# Patient Record
Sex: Male | Born: 1965 | Race: Black or African American | Hispanic: No | Smoking: Never smoker
Health system: Southern US, Community
[De-identification: ages and names within clinical notes are randomized; demographics above are authoritative.]

## PROBLEM LIST (undated history)

## (undated) DIAGNOSIS — E781 Pure hyperglyceridemia: Secondary | ICD-10-CM

## (undated) DIAGNOSIS — R079 Chest pain, unspecified: Secondary | ICD-10-CM

## (undated) DIAGNOSIS — N179 Acute kidney failure, unspecified: Secondary | ICD-10-CM

## (undated) DIAGNOSIS — I471 Supraventricular tachycardia, unspecified: Secondary | ICD-10-CM

## (undated) DIAGNOSIS — A530 Latent syphilis, unspecified as early or late: Secondary | ICD-10-CM

## (undated) DIAGNOSIS — I10 Essential (primary) hypertension: Secondary | ICD-10-CM

## (undated) DIAGNOSIS — F528 Other sexual dysfunction not due to a substance or known physiological condition: Secondary | ICD-10-CM

## (undated) HISTORY — PX: NO PAST SURGERIES: SHX2092

## (undated) HISTORY — DX: Acute kidney failure, unspecified: N17.9

## (undated) HISTORY — DX: Chest pain, unspecified: R07.9

## (undated) HISTORY — DX: Pure hyperglyceridemia: E78.1

## (undated) HISTORY — DX: Latent syphilis, unspecified as early or late: A53.0

## (undated) HISTORY — DX: Supraventricular tachycardia, unspecified: I47.10

## (undated) HISTORY — DX: Other sexual dysfunction not due to a substance or known physiological condition: F52.8

## (undated) HISTORY — DX: Supraventricular tachycardia: I47.1

---

## 2002-09-06 ENCOUNTER — Encounter: Payer: Self-pay | Admitting: Emergency Medicine

## 2002-09-06 ENCOUNTER — Emergency Department (HOSPITAL_COMMUNITY): Admission: EM | Admit: 2002-09-06 | Discharge: 2002-09-06 | Payer: Self-pay | Admitting: Emergency Medicine

## 2004-07-07 ENCOUNTER — Emergency Department (HOSPITAL_COMMUNITY): Admission: EM | Admit: 2004-07-07 | Discharge: 2004-07-07 | Payer: Self-pay | Admitting: Emergency Medicine

## 2005-07-01 DIAGNOSIS — I10 Essential (primary) hypertension: Secondary | ICD-10-CM

## 2005-07-01 HISTORY — DX: Essential (primary) hypertension: I10

## 2005-09-17 ENCOUNTER — Emergency Department (HOSPITAL_COMMUNITY): Admission: EM | Admit: 2005-09-17 | Discharge: 2005-09-17 | Payer: Self-pay | Admitting: Family Medicine

## 2005-12-02 ENCOUNTER — Emergency Department (HOSPITAL_COMMUNITY): Admission: EM | Admit: 2005-12-02 | Discharge: 2005-12-02 | Payer: Self-pay | Admitting: Emergency Medicine

## 2006-04-29 ENCOUNTER — Emergency Department (HOSPITAL_COMMUNITY): Admission: EM | Admit: 2006-04-29 | Discharge: 2006-04-29 | Payer: Self-pay | Admitting: Family Medicine

## 2006-09-17 ENCOUNTER — Emergency Department (HOSPITAL_COMMUNITY): Admission: EM | Admit: 2006-09-17 | Discharge: 2006-09-17 | Payer: Self-pay | Admitting: Emergency Medicine

## 2007-01-12 ENCOUNTER — Emergency Department (HOSPITAL_COMMUNITY): Admission: EM | Admit: 2007-01-12 | Discharge: 2007-01-12 | Payer: Self-pay | Admitting: Internal Medicine

## 2008-01-04 ENCOUNTER — Emergency Department (HOSPITAL_COMMUNITY): Admission: EM | Admit: 2008-01-04 | Discharge: 2008-01-05 | Payer: Self-pay | Admitting: Emergency Medicine

## 2008-07-12 ENCOUNTER — Emergency Department (HOSPITAL_COMMUNITY): Admission: EM | Admit: 2008-07-12 | Discharge: 2008-07-12 | Payer: Self-pay | Admitting: Emergency Medicine

## 2008-09-28 ENCOUNTER — Ambulatory Visit: Payer: Self-pay | Admitting: Nurse Practitioner

## 2008-09-28 DIAGNOSIS — F528 Other sexual dysfunction not due to a substance or known physiological condition: Secondary | ICD-10-CM | POA: Insufficient documentation

## 2008-09-29 ENCOUNTER — Ambulatory Visit: Payer: Self-pay | Admitting: *Deleted

## 2008-10-05 DIAGNOSIS — A530 Latent syphilis, unspecified as early or late: Secondary | ICD-10-CM | POA: Insufficient documentation

## 2008-10-06 ENCOUNTER — Encounter (INDEPENDENT_AMBULATORY_CARE_PROVIDER_SITE_OTHER): Payer: Self-pay | Admitting: Nurse Practitioner

## 2008-10-07 ENCOUNTER — Telehealth (INDEPENDENT_AMBULATORY_CARE_PROVIDER_SITE_OTHER): Payer: Self-pay | Admitting: Nurse Practitioner

## 2008-10-07 ENCOUNTER — Ambulatory Visit: Payer: Self-pay | Admitting: Nurse Practitioner

## 2008-10-07 DIAGNOSIS — E781 Pure hyperglyceridemia: Secondary | ICD-10-CM | POA: Insufficient documentation

## 2008-10-07 LAB — CONVERTED CEMR LAB: HDL goal, serum: 40 mg/dL

## 2009-07-04 ENCOUNTER — Emergency Department (HOSPITAL_COMMUNITY): Admission: EM | Admit: 2009-07-04 | Discharge: 2009-07-05 | Payer: Self-pay | Admitting: Emergency Medicine

## 2010-02-19 ENCOUNTER — Encounter: Admission: RE | Admit: 2010-02-19 | Discharge: 2010-02-19 | Payer: Self-pay | Admitting: Family Medicine

## 2010-07-05 ENCOUNTER — Emergency Department (HOSPITAL_COMMUNITY)
Admission: EM | Admit: 2010-07-05 | Discharge: 2010-07-05 | Payer: Self-pay | Source: Home / Self Care | Admitting: Emergency Medicine

## 2010-07-29 LAB — CONVERTED CEMR LAB
AST: 26 units/L (ref 0–37)
Albumin: 4.5 g/dL (ref 3.5–5.2)
Alkaline Phosphatase: 66 units/L (ref 39–117)
BUN: 18 mg/dL (ref 6–23)
Basophils Absolute: 0 10*3/uL (ref 0.0–0.1)
Bilirubin Urine: NEGATIVE
Eosinophils Relative: 1 % (ref 0–5)
Glucose, Urine, Semiquant: NEGATIVE
HCT: 48 % (ref 39.0–52.0)
HDL: 31 mg/dL — ABNORMAL LOW (ref >39)
Ketones, urine, test strip: NEGATIVE
Lymphocytes Relative: 33 % (ref 12–46)
Neutro Abs: 5.9 10*3/uL (ref 1.7–7.7)
Nitrite: NEGATIVE
Platelets: 175 10*3/uL (ref 150–400)
Potassium: 4.4 meq/L (ref 3.5–5.3)
Protein, U semiquant: 30
RDW: 15.2 % (ref 11.5–15.5)
RPR Ser Ql: REACTIVE — AB
Specific Gravity, Urine: 1.02
TSH: 0.804 microintl units/mL (ref 0.350–4.500)
Total CHOL/HDL Ratio: 5.8
Urobilinogen, UA: 0.2
WBC Urine, dipstick: NEGATIVE
pH: 5.5

## 2010-09-16 LAB — DIFFERENTIAL
Eosinophils Relative: 0 % (ref 0–5)
Lymphocytes Relative: 11 % — ABNORMAL LOW (ref 12–46)
Lymphs Abs: 1.8 10*3/uL (ref 0.7–4.0)
Monocytes Absolute: 0.4 10*3/uL (ref 0.1–1.0)
Monocytes Relative: 3 % (ref 3–12)

## 2010-09-16 LAB — URINALYSIS, ROUTINE W REFLEX MICROSCOPIC
Leukocytes, UA: NEGATIVE
Nitrite: NEGATIVE
Specific Gravity, Urine: 1.022 (ref 1.005–1.030)
pH: 5.5 (ref 5.0–8.0)

## 2010-09-16 LAB — CBC
Platelets: 240 10*3/uL (ref 150–400)
RDW: 14 % (ref 11.5–15.5)
WBC: 16.8 10*3/uL — ABNORMAL HIGH (ref 4.0–10.5)

## 2010-09-16 LAB — BASIC METABOLIC PANEL
BUN: 18 mg/dL (ref 6–23)
Creatinine, Ser: 1.45 mg/dL (ref 0.4–1.5)
GFR calc non Af Amer: 54 mL/min — ABNORMAL LOW (ref >60)

## 2010-09-16 LAB — URINE MICROSCOPIC-ADD ON

## 2010-09-16 LAB — POCT CARDIAC MARKERS: Troponin i, poc: <0.05 ng/mL (ref 0.00–0.09)

## 2010-10-15 LAB — URINALYSIS, ROUTINE W REFLEX MICROSCOPIC
Ketones, ur: NEGATIVE mg/dL
Nitrite: NEGATIVE
Protein, ur: 30 mg/dL — AB
pH: 6.5 (ref 5.0–8.0)

## 2011-04-16 LAB — DIFFERENTIAL
Basophils Absolute: 0
Basophils Relative: 1
Neutro Abs: 5.4
Neutrophils Relative %: 61

## 2011-04-16 LAB — BASIC METABOLIC PANEL
CO2: 29
Calcium: 9.3
Creatinine, Ser: 1.45
GFR calc Af Amer: >60
Glucose, Bld: 105 — ABNORMAL HIGH

## 2011-04-16 LAB — CBC
MCHC: 33.7
RDW: 14.4 — ABNORMAL HIGH

## 2011-04-16 LAB — URINALYSIS, ROUTINE W REFLEX MICROSCOPIC
Nitrite: NEGATIVE
Specific Gravity, Urine: 1.017
Urobilinogen, UA: 1

## 2011-04-16 LAB — URINE MICROSCOPIC-ADD ON: Urine-Other: NONE SEEN

## 2011-12-11 ENCOUNTER — Emergency Department (HOSPITAL_COMMUNITY): Payer: Self-pay

## 2011-12-11 ENCOUNTER — Observation Stay (HOSPITAL_COMMUNITY)
Admission: EM | Admit: 2011-12-11 | Discharge: 2011-12-12 | Disposition: A | Discharge status: Home / Self Care | Payer: Self-pay | Attending: Family Medicine | Admitting: Family Medicine

## 2011-12-11 ENCOUNTER — Encounter (HOSPITAL_COMMUNITY): Payer: Self-pay | Admitting: *Deleted

## 2011-12-11 DIAGNOSIS — I10 Essential (primary) hypertension: Secondary | ICD-10-CM

## 2011-12-11 DIAGNOSIS — R0602 Shortness of breath: Secondary | ICD-10-CM | POA: Insufficient documentation

## 2011-12-11 DIAGNOSIS — R209 Unspecified disturbances of skin sensation: Secondary | ICD-10-CM | POA: Insufficient documentation

## 2011-12-11 DIAGNOSIS — I471 Supraventricular tachycardia, unspecified: Secondary | ICD-10-CM | POA: Diagnosis present

## 2011-12-11 DIAGNOSIS — I129 Hypertensive chronic kidney disease with stage 1 through stage 4 chronic kidney disease, or unspecified chronic kidney disease: Secondary | ICD-10-CM | POA: Insufficient documentation

## 2011-12-11 DIAGNOSIS — R079 Chest pain, unspecified: Secondary | ICD-10-CM

## 2011-12-11 DIAGNOSIS — I498 Other specified cardiac arrhythmias: Principal | ICD-10-CM | POA: Insufficient documentation

## 2011-12-11 DIAGNOSIS — N179 Acute kidney failure, unspecified: Secondary | ICD-10-CM

## 2011-12-11 DIAGNOSIS — N183 Chronic kidney disease, stage 3 unspecified: Secondary | ICD-10-CM | POA: Insufficient documentation

## 2011-12-11 HISTORY — DX: Essential (primary) hypertension: I10

## 2011-12-11 LAB — BASIC METABOLIC PANEL
Chloride: 101 mEq/L (ref 96–112)
Creatinine, Ser: 2.26 mg/dL — ABNORMAL HIGH (ref 0.50–1.35)
GFR calc Af Amer: 39 mL/min — ABNORMAL LOW (ref >90)
GFR calc non Af Amer: 34 mL/min — ABNORMAL LOW (ref >90)
Potassium: 4.1 mEq/L (ref 3.5–5.1)

## 2011-12-11 LAB — TROPONIN I: Troponin I: <0.3 ng/mL (ref <0.30)

## 2011-12-11 LAB — CBC
MCV: 78.7 fL (ref 78.0–100.0)
Platelets: 168 10*3/uL (ref 150–400)
RDW: 13.6 % (ref 11.5–15.5)
WBC: 12.2 10*3/uL — ABNORMAL HIGH (ref 4.0–10.5)

## 2011-12-11 MED ORDER — NITROGLYCERIN 0.4 MG SL SUBL: 0.4000 mg | SUBLINGUAL_TABLET | SUBLINGUAL | Status: DC | PRN

## 2011-12-11 MED ORDER — SODIUM CHLORIDE 0.9 % IV SOLN
Freq: Once | INTRAVENOUS | Status: AC
  Administered 2011-12-11: via INTRAVENOUS

## 2011-12-11 MED ORDER — ADENOSINE 6 MG/2ML IV SOLN
INTRAVENOUS | Status: AC
  Administered 2011-12-11
  Filled 2011-12-11: qty 8

## 2011-12-11 MED ORDER — SODIUM CHLORIDE 0.9 % IV BOLUS (SEPSIS)
1000.0000 mL | Freq: Once | INTRAVENOUS | Status: AC
  Administered 2011-12-11: 1000 mL via INTRAVENOUS

## 2011-12-11 MED ORDER — ASPIRIN EC 325 MG PO TBEC
325.0000 mg | DELAYED_RELEASE_TABLET | Freq: Once | ORAL | Status: AC
  Administered 2011-12-11: 325 mg via ORAL
  Filled 2011-12-11: qty 1

## 2011-12-11 NOTE — H&P (Signed)
Samuel Casey is an 46 y.o. male.   PCP - Dr.Hooper in [place]. Chief Complaint: Chest pain. HPI: 46 year old male with known history of hypertension today around 2:00 started hurting some chest pressure radiating to his left arm and has been coming off and on for almost an hour. His friends called the EMS. When EMS reached there patient was found to be in SVT and was given adenosine and the SVT was aborted. He had mild shortness of breath associated with the chest pain but denies any palpitations. Denies any nausea, vomiting abdominal pain diarrhea. Patient states over the last 2 days he's been feeling not well. When the patient reached the ER he was in sinus rhythm and cardiac enzymes are negative patient will be admitted for further management and observation. Patient denies having similar episodes before. In addition patient was found to have worsening creatinine.  Past Medical History  Diagnosis Date  . Hypertension     History reviewed. No pertinent past surgical history.  Family History  Problem Relation Age of Onset  . Sickle cell anemia Other    Social History:  reports that he has never smoked. He does not have any smokeless tobacco history on file. He reports that he does not drink alcohol or use illicit drugs.  Allergies: No Known Allergies   (Not in a hospital admission)  Results for orders placed during the hospital encounter of 12/11/11 (from the past 48 hour(s))  CBC     Status: Abnormal   Collection Time   12/11/11  7:31 PM      Component Value Range Comment   WBC 12.2 (*) 4.0 - 10.5 K/uL    RBC 5.92 (*) 4.22 - 5.81 MIL/uL    Hemoglobin 16.4  13.0 - 17.0 g/dL    HCT 86.5  78.4 - 69.6 %    MCV 78.7  78.0 - 100.0 fL    MCH 27.7  26.0 - 34.0 pg    MCHC 35.2  30.0 - 36.0 g/dL    RDW 29.5  28.4 - 13.2 %    Platelets 168  150 - 400 K/uL   BASIC METABOLIC PANEL     Status: Abnormal   Collection Time   12/11/11  7:31 PM      Component Value Range Comment   Sodium 140   135 - 145 mEq/L    Potassium 4.1  3.5 - 5.1 mEq/L    Chloride 101  96 - 112 mEq/L    CO2 25  19 - 32 mEq/L    Glucose, Bld 117 (*) 70 - 99 mg/dL    BUN 21  6 - 23 mg/dL    Creatinine, Ser 4.40 (*) 0.50 - 1.35 mg/dL    Calcium 10.2  8.4 - 10.5 mg/dL    GFR calc non Af Amer 34 (*) >90 mL/min    GFR calc Af Amer 39 (*) >90 mL/min   TROPONIN I     Status: Normal   Collection Time   12/11/11  7:31 PM      Component Value Range Comment   Troponin I <0.30  <0.30 ng/mL    Dg Chest 2 View  12/11/2011  *RADIOLOGY REPORT*  Clinical Data: Chest pressure.  CHEST - 2 VIEW  Comparison: PA and lateral chest 02/19/2010.  Findings: Lungs clear.  No pneumothorax or effusion.  Heart size normal.  IMPRESSION: Negative chest.  Original Report Authenticated By: Bernadene Bell. Maricela Curet, M.D.    Review of Systems  Constitutional: Negative.  HENT: Negative.   Eyes: Negative.   Respiratory: Positive for shortness of breath.   Cardiovascular: Positive for chest pain.  Gastrointestinal: Negative.   Genitourinary: Negative.   Musculoskeletal: Negative.   Skin: Negative.   Neurological: Negative.   Endo/Heme/Allergies: Negative.   Psychiatric/Behavioral: Negative.     Blood pressure 148/91, pulse 86, temperature 98.4 F (36.9 C), temperature source Oral, resp. rate 19, SpO2 99.00%. Physical Exam  Constitutional: He is oriented to person, place, and time. He appears well-developed and well-nourished. No distress.  HENT:  Head: Normocephalic and atraumatic.  Right Ear: External ear normal.  Left Ear: External ear normal.  Nose: Nose normal.  Mouth/Throat: Oropharynx is clear and moist. No oropharyngeal exudate.  Eyes: Conjunctivae are normal. Pupils are equal, round, and reactive to light. Right eye exhibits no discharge. Left eye exhibits no discharge. No scleral icterus.  Neck: Normal range of motion. Neck supple.  Cardiovascular: Normal rate and regular rhythm.   Respiratory: Effort normal and breath  sounds normal. No respiratory distress. He has no wheezes. He has no rales.  GI: Soft. Bowel sounds are normal. He exhibits no distension. There is no tenderness. There is no rebound.  Musculoskeletal: Normal range of motion. He exhibits no edema and no tenderness.  Neurological: He is alert and oriented to person, place, and time.       Moves all extremities.  Skin: Skin is warm and dry. He is not diaphoretic. No erythema.  Psychiatric: His behavior is normal.     Assessment/Plan #1. SVT - presently in sinus rhythm and rate control. Check thyroid function test for hyperthyroidism. Check d-dimer and if positive get a VQ scan. Continue Tenormin. #2. Chest pain - presently chest pain-free. Cycle cardiac markers. Check d-dimer. Check 2-D echo. Aspirin. #3. Acute renal failure probably on chronic kidney disease - at this time and gently hydrating patient. Check urinalysis. Closely follow intake output and metabolic panel. #4. Hypertension - continue present medications. #5. Leukocytosis - patient is afebrile. Follow CBC. UA is pending. Chest x-ray does not show any infiltrates.  CODE STATUS - full code.  Eduard Clos 12/11/2011, 11:37 PM

## 2011-12-11 NOTE — ED Notes (Signed)
Received report from [facility]. Per RN, pt begin having left sided CP and Left arm pain. When EMS was called his HR was between 130s-180s.  Adenosine was given x2 by EMS and HR decreased to 130s. Per pt he does not remember anything prior to him being in the ambulance. Currently he is having left sided Chest pressure that is 5 out of 10. He is also having left lower arm tingling. No respiratory distress. No n/v. Slight diaphoresis. HR in 120s-130s. Will continue to monitor.

## 2011-12-11 NOTE — ED Notes (Signed)
To ED via EMS for eval of left arm pain and cp. Found in SVT. Pt was given 2 does of Adenosine IVP by ems and converted to SR

## 2011-12-11 NOTE — ED Provider Notes (Signed)
History     CSN: [id number]  Arrival date & time 12/11/11  1857   None     Chief Complaint  Patient presents with  . Chest Pain    (Consider location/radiation/quality/duration/timing/severity/associated sxs/prior treatment) HPI  46 year old male chief complaint chest pain shortness of breath left arm paresthesia. 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 His administrators called EMS. He is amnestic to the events until being in the EMS truck. Per EMS he demonstrated a supraventricular tachycardia and was given adenosine which apparently extinguishes arrhythmia.  He presents now with 2/10 chest pressure, mild shortness of breath. Denies any recent illnesses. Endorses recent decrease in urine output. Denies any recent dyspnea with exertion or chest pain with exertion. Tachycardia on arrival.  Hypertensive on arrival. Otherwise normal vital signs.  Past Medical History  Diagnosis Date  . Hypertension     History reviewed. No pertinent past surgical history.  Family History  Problem Relation Age of Onset  . Sickle cell anemia Other     History  Substance Use Topics  . Smoking status: Never Smoker   . Smokeless tobacco: Not on file  . Alcohol Use: No      Review of Systems Constitutional: Negative for fever and chills.  HENT: Negative for ear pain, sore throat and trouble swallowing.   Eyes: Negative for pain and visual disturbance.  Respiratory: Negative for cough and POS shortness of breath.   Cardiovascular: POS for chest pain and leg swelling.  Gastrointestinal: Negative for nausea, vomiting, abdominal pain and diarrhea.  Genitourinary: Negative for dysuria, urgency and frequency.  Musculoskeletal: Negative for back pain and joint swelling.  Skin: Negative for rash and wound.  Neurological: Negative for dizziness, syncope, speech difficulty,  weakness and POS numbness LUE.     Allergies  Review of patient's allergies indicates no known allergies.  Home Medications   Current Outpatient Rx  Name Route Sig Dispense Refill  . AMLODIPINE BESYLATE 10 MG PO TABS Oral Take 10 mg by mouth daily.    . ATENOLOL 50 MG PO TABS Oral Take 50 mg by mouth daily.    Marland Kitchen CALCIUM CARBONATE 1250 MG PO TABS Oral Take 1 tablet by mouth daily.    Marland Kitchen HYDROCHLOROTHIAZIDE 25 MG PO TABS Oral Take 25 mg by mouth daily.    Marland Kitchen VITAMIN A 24401 UNITS PO CAPS Oral Take 10,000 Units by mouth daily.    Marland Kitchen VITAMIN B-12 1000 MCG PO TABS Oral Take 1,000 mcg by mouth daily.      BP 148/91  Pulse 86  Temp 98.4 F (36.9 C) (Oral)  Resp 19  SpO2 99%  Physical Exam Consitutional: Pt in no acute distress.   Head: Normocephalic and atraumatic.  Eyes: Extraocular motion intact, no scleral icterus Neck: Supple without meningismus, mass, or overt JVD Respiratory: Effort normal and breath sounds normal. No respiratory distress. CV: Heart regular rate and rhythm, no obvious murmurs.  Pulses +2 and symmetric Abdomen: Soft, non-tender, non-distended MSK: Extremities are atraumatic without deformity, ROM intact Skin: Warm, dry, intact Neuro: Alert and oriented, no motor deficit noted.  CNs intact.  PERRL.  Reflexes normal BLE, no clonus.  Hips, knee and ankle, arm and wrist strength maintained.  FTN and RAM normal.  CNs 2-12 normal.  Psychiatric: Mood and affect are normal  EKG:  Rate: 131 Rythym: sinus Interval normal Axis: appears LAD  RBBB Sharp T waves.  Greater amplitude of t-waves then previous.   ED Course  Procedures (including critical care time)  Labs Reviewed  CBC - Abnormal; Notable for the following:    WBC 12.2 (*)     RBC 5.92 (*)     All other components within normal limits  BASIC METABOLIC PANEL - Abnormal; Notable for the following:    Glucose, Bld 117 (*)     Creatinine, Ser 2.26 (*)     GFR calc non Af Amer 34 (*)     GFR calc Af  Amer 39 (*)     All other components within normal limits  TROPONIN I   Dg Chest 2 View  12/11/2011  *RADIOLOGY REPORT*  Clinical Data: Chest pressure.  CHEST - 2 VIEW  Comparison: PA and lateral chest 02/19/2010.  Findings: Lungs clear.  No pneumothorax or effusion.  Heart size normal.  IMPRESSION: Negative chest.  Original Report Authenticated By: Bernadene Bell. D'ALESSIO, M.D.     1. Acute renal failure   2. SVT (supraventricular tachycardia)       MDM  Apparent demonstrated SVT. She was continuing chest pressure and story consistent with acute coronary syndrome. Chest pain workup. Aspirin. Nitroglycerin.  Negative troponin. Acute renal failure. Patient given fluids. Tachycardia resolved. Patient asymptomatic now. Patient admitted for continued troponin workup as well as treatment for acute renal failure. Patient admitted stable and pain-free.         Larrie Kass, MD 12/11/11 [phone]

## 2011-12-12 DIAGNOSIS — R079 Chest pain, unspecified: Secondary | ICD-10-CM

## 2011-12-12 DIAGNOSIS — N179 Acute kidney failure, unspecified: Secondary | ICD-10-CM

## 2011-12-12 DIAGNOSIS — I10 Essential (primary) hypertension: Secondary | ICD-10-CM

## 2011-12-12 DIAGNOSIS — I471 Supraventricular tachycardia: Secondary | ICD-10-CM

## 2011-12-12 LAB — CBC
HCT: 45.7 % (ref 39.0–52.0)
HCT: 46.2 % (ref 39.0–52.0)
Platelets: 157 10*3/uL (ref 150–400)
Platelets: 160 10*3/uL (ref 150–400)
RBC: 5.7 MIL/uL (ref 4.22–5.81)
RBC: 5.74 MIL/uL (ref 4.22–5.81)
RDW: 14 % (ref 11.5–15.5)
RDW: 14.1 % (ref 11.5–15.5)
WBC: 10.6 10*3/uL — ABNORMAL HIGH (ref 4.0–10.5)
WBC: 8.5 10*3/uL (ref 4.0–10.5)

## 2011-12-12 LAB — URINALYSIS, ROUTINE W REFLEX MICROSCOPIC
Bilirubin Urine: NEGATIVE
Ketones, ur: NEGATIVE mg/dL
Nitrite: NEGATIVE
Protein, ur: 30 mg/dL — AB
Urobilinogen, UA: 1 mg/dL (ref 0.0–1.0)

## 2011-12-12 LAB — CARDIAC PANEL(CRET KIN+CKTOT+MB+TROPI)
CK, MB: 4.4 ng/mL — ABNORMAL HIGH (ref 0.3–4.0)
Relative Index: 0.8 (ref 0.0–2.5)
Relative Index: 0.8 (ref 0.0–2.5)
Total CK: 531 U/L — ABNORMAL HIGH (ref 7–232)
Troponin I: <0.3 ng/mL (ref <0.30)

## 2011-12-12 LAB — COMPREHENSIVE METABOLIC PANEL
Alkaline Phosphatase: 75 U/L (ref 39–117)
BUN: 21 mg/dL (ref 6–23)
Calcium: 9.5 mg/dL (ref 8.4–10.5)
Creatinine, Ser: 1.63 mg/dL — ABNORMAL HIGH (ref 0.50–1.35)
GFR calc Af Amer: 58 mL/min — ABNORMAL LOW (ref >90)
Glucose, Bld: 105 mg/dL — ABNORMAL HIGH (ref 70–99)
Potassium: 3.7 mEq/L (ref 3.5–5.1)
Total Protein: 7.8 g/dL (ref 6.0–8.3)

## 2011-12-12 LAB — T4, FREE: Free T4: 1.05 ng/dL (ref 0.80–1.80)

## 2011-12-12 LAB — CREATININE, SERUM
GFR calc Af Amer: 49 mL/min — ABNORMAL LOW (ref >90)
GFR calc non Af Amer: 42 mL/min — ABNORMAL LOW (ref >90)

## 2011-12-12 LAB — D-DIMER, QUANTITATIVE: D-Dimer, Quant: 0.41 ug/mL-FEU (ref 0.00–0.48)

## 2011-12-12 MED ORDER — ONDANSETRON HCL 4 MG PO TABS: 4.0000 mg | ORAL_TABLET | Freq: Four times a day (QID) | ORAL | Status: DC | PRN

## 2011-12-12 MED ORDER — SODIUM CHLORIDE 0.9 % IJ SOLN
3.0000 mL | Freq: Two times a day (BID) | INTRAMUSCULAR | Status: DC
  Administered 2011-12-12 (×2): 3 mL via INTRAVENOUS

## 2011-12-12 MED ORDER — ACETAMINOPHEN 650 MG RE SUPP: 650.0000 mg | Freq: Four times a day (QID) | RECTAL | Status: DC | PRN

## 2011-12-12 MED ORDER — ASPIRIN EC 325 MG PO TBEC
325.0000 mg | DELAYED_RELEASE_TABLET | Freq: Every day | ORAL | Status: DC
  Administered 2011-12-12: 325 mg via ORAL
  Filled 2011-12-12: qty 1

## 2011-12-12 MED ORDER — AMLODIPINE BESYLATE 10 MG PO TABS
10.0000 mg | ORAL_TABLET | Freq: Every day | ORAL | Status: DC
  Administered 2011-12-12: 10 mg via ORAL
  Filled 2011-12-12: qty 1

## 2011-12-12 MED ORDER — ATENOLOL 50 MG PO TABS
50.0000 mg | ORAL_TABLET | Freq: Every day | ORAL | Status: DC
  Administered 2011-12-12: 50 mg via ORAL
  Filled 2011-12-12: qty 1

## 2011-12-12 MED ORDER — ACETAMINOPHEN 325 MG PO TABS: 650.0000 mg | ORAL_TABLET | Freq: Four times a day (QID) | ORAL | Status: DC | PRN

## 2011-12-12 MED ORDER — HYDROCOD POLST-CHLORPHEN POLST 10-8 MG/5ML PO LQCR
5.0000 mL | Freq: Two times a day (BID) | ORAL | Status: DC | PRN
  Administered 2011-12-12: 5 mL via ORAL
  Filled 2011-12-12: qty 5

## 2011-12-12 MED ORDER — HEPARIN SODIUM (PORCINE) 5000 UNIT/ML IJ SOLN
5000.0000 [IU] | Freq: Three times a day (TID) | INTRAMUSCULAR | Status: DC
  Administered 2011-12-12 (×2): 5000 [IU] via SUBCUTANEOUS
  Filled 2011-12-12 (×5): qty 1

## 2011-12-12 MED ORDER — SODIUM CHLORIDE 0.9 % IV SOLN
INTRAVENOUS | Status: DC
  Administered 2011-12-12 (×2): via INTRAVENOUS

## 2011-12-12 MED ORDER — ONDANSETRON HCL 4 MG/2ML IJ SOLN: 4.0000 mg | Freq: Four times a day (QID) | INTRAMUSCULAR | Status: DC | PRN

## 2011-12-12 NOTE — Care Management Note (Unsigned)
    Page 1 of 1   12/12/2011     11:33:24 AM   CARE MANAGEMENT NOTE 12/12/2011  Patient:  Samuel Casey, Samuel Casey   Account Number:  [id number]  Date Initiated:  12/12/2011  Documentation initiated by:  SIMMONS,Pravin Perezperez  Subjective/Objective Assessment:   ADMITTED WITH SVT; LIVES AT HOME WITH FAMILY; WORKS AS CONSTRUCTION WORKER; IPTA.     Action/Plan:   DISCHARGE PLANNING INITIATED.   Anticipated DC Date:  12/13/2011   Anticipated DC Plan:  HOME/SELF CARE      DC Planning Services  CM consult      Choice offered to / List presented to:             Status of service:  In process, will continue to follow Medicare Important Message given?   (If response is "NO", the following Medicare IM given date fields will be blank) Date Medicare IM given:   Date Additional Medicare IM given:    Discharge Disposition:    Per UR Regulation:  Reviewed for med. necessity/level of care/duration of stay  If discussed at Long Length of Stay Meetings, dates discussed:    Comments:  12/12/11  1132  Samuel Casey SIMMONS RN, BSN [phone] NCM WILL FOLLOW.

## 2011-12-12 NOTE — Progress Notes (Signed)
[facility] PROGRESS NOTE  Samuel Casey GNF:[id number] DOB: 1966-03-26 DOA: 12/11/2011 PCP: Dr. Quintella Reichert in [place]. [phone]  Assessment/Plan: 1. SVT: By report converted with adenosine. Patient has difficulty remembering to take his medications daily and may not have taken his atenolol yesterday. 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 Denies caffeine intake. Follow-up TFT as outpatient. D-dimer negative. 2. Chest pain: Troponins negative. 2-d echocardiogram reassuring. Elevated total CK and MB consistent with large muscle mass. 3. Acute renal failure/CKD II-III: Improved with IVF. Continue to hold HCTZ--patient is been off this for one month. 4. Leukocytosis: Resolved--secondary to stress reaction. 5. Hypertension: Fair control. Followup as an outpatient.  Patient reports a fast heart rate at times although he has never had episode like this before. Most likely multifactorial in nature--possibly forgotten atenolol with rebound tachycardia, dehydration secondary to strenuous outside work. Patient has no history of chest pain with exertion and is quite active. Signs and symptoms most consistent with SVT and coronary disease is doubted at this point. Will plan for outpatient cardiology evaluation for further recommendations. I had been unable to contact his physician at the number listed above which I have confirmed is the correct office number.  Code Status: Full Family Communication: None bedside Disposition Plan: Home with outpatient followup with cardiology  Brendia Sacks, MD  [facility] Pager [phone]. If 8PM-8AM, please contact night-coverage at [email or web address], password [facility] 12/12/2011, 9:49 AM  LOS: 1 day   Brief narrative: 46 year old man presented with SVT by report and converted with adenosine. Developed palpitations, left arm pain, shortness of breath and chest pain. All symptoms resolved with conversion of rhythm. No  further issues.  Consultants:  None  Procedures:  None  HPI/Subjective: No recurrent chest pain or shortness of breath. Feels well.  Objective: Filed Vitals:   12/11/11 2315 12/11/11 2330 12/12/11 0009 12/12/11 0549  BP: 141/102 148/91 148/92 156/90  Pulse: 84 86 89 70  Temp:   98.1 F (36.7 C) 97.6 F (36.4 C)  TempSrc:      Resp: 15 19 18 18   Height:   6' (1.829 m)   Weight:   111.177 kg (245 lb 1.6 oz)   SpO2: 99% 99% 96% 95%    Intake/Output Summary (Last 24 hours) at 12/12/11 0949 Last data filed at 12/12/11 0043  Gross per 24 hour  Intake      0 ml  Output    500 ml  Net   -500 ml    Exam:   General:  Appears calm and comfortable.  Cardiovascular: Regular rate and rhythm. No murmur, rub, gallop. No lower extremity edema.  Respiratory: Clear to auscultation bilaterally. No wheezes, rales, rhonchi. Normal respiratory effort.  Telemetry: Sinus rhythm. No arrhythmias.  Data Reviewed: Basic Metabolic Panel:  Lab 12/12/11 1027 12/12/11 0016 12/11/11 1931  NA 138 -- 140  K 3.7 -- 4.1  CL 103 -- 101  CO2 26 -- 25  GLUCOSE 105* -- 117*  BUN 21 -- 21  CREATININE 1.63* 1.88* 2.26*  CALCIUM 9.5 -- 10.0  MG -- -- --  PHOS -- -- --   Liver Function Tests:  Lab 12/12/11 0519  AST 24  ALT 21  ALKPHOS 75  BILITOT 0.4  PROT 7.8  ALBUMIN 3.8   CBC:  Lab 12/12/11 0519 12/12/11 0016 12/11/11 1931  WBC 8.5 10.6* 12.2*  NEUTROABS -- -- --  HGB 15.4 15.6 16.4  HCT 45.7 46.2 46.6  MCV 80.2 80.5  78.7  PLT 157 160 168   Cardiac Enzymes:  Lab 12/12/11 0840 12/12/11 0016 12/11/11 1931  CKTOTAL 531* 448* --  CKMB 4.4* 3.7 --  CKMBINDEX -- -- --  TROPONINI <0.30 <0.30 <0.30   Studies: Dg Chest 2 View  12/11/2011  *RADIOLOGY REPORT*  Clinical Data: Chest pressure.  CHEST - 2 VIEW  Comparison: PA and lateral chest 02/19/2010.  Findings: Lungs clear.  No pneumothorax or effusion.  Heart size normal.  IMPRESSION: Negative chest.  Original Report  Authenticated By: Bernadene Bell. D'ALESSIO, M.D.   Scheduled Meds:   . sodium chloride   Intravenous Once  . adenosine      . amLODipine  10 mg Oral Daily  . aspirin EC  325 mg Oral Once  . aspirin EC  325 mg Oral Daily  . atenolol  50 mg Oral Daily  . heparin  5,000 Units Subcutaneous Q8H  . sodium chloride  1,000 mL Intravenous Once  . sodium chloride  3 mL Intravenous Q12H   Continuous Infusions:   . sodium chloride 100 mL/hr at 12/12/11 0929   EKG: SR, no acute changes.  Principal Problem:  *SVT (supraventricular tachycardia) Active Problems:  HYPERTENSION, BENIGN ESSENTIAL  Chest pain  ARF (acute renal failure)

## 2011-12-12 NOTE — Discharge Summary (Addendum)
Physician Discharge Summary  Samuel Casey NWG:[id number] DOB: 05-Oct-1965 DOA: 12/11/2011  PCP: Dr. Quintella Casey in [place]. [phone]  Admit date: 12/11/2011 Discharge date: 12/12/2011  Recommendations for Outpatient Follow-up:  1. Followup with cardiology for history of SVT, further recommendations and any further evaluation--[place] office will contact patient. 2. Followup with primary care physician approximately one week. 3. Followup TSH which is currently pending. 4. Consider repeat basic metabolic panel in approximately one week to reassess kidney function. 5. Followup hypertension--patient reports poor control at home and may require another agent.    Discharge Diagnoses:  1. Supraventricular tachycardia 2. Chest pain secondary to supraventricular tachycardia 3. 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 Hypertension  Discharge Condition: Improved Disposition: Home  Diet recommendation: Heart healthy  History of present illness:  46 year old man presented with SVT by report and converted with adenosine (by EMS). Developed palpitations, left arm pain, shortness of breath and chest pain. All symptoms resolved with conversion of rhythm. No further issues.  Hospital Course:  Mr. Samuel Casey was admitted to the medical floor. Telemetry was notable for sinus rhythm with no arrhythmias. He has had no recurrence of symptoms. Troponins have been negative. Mild elevation of CK and CK-MB consistent with large muscle mass. 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 Additionally he may have forgotten to take his atenolol. He is encouraged to drink fluids and electrolytes. 2-D echocardiogram was reassuring. Patient has no history of chest pain and is quite active and has a strenuous job. This appears be uncomplicated SVT at this point. Followup with cardiology as an outpatient for any  further recommendations. 1. SVT: By report converted with adenosine. Patient has difficulty remembering to take his medications daily and may not have taken his atenolol yesterday. 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 Denies caffeine intake. Follow-up TFT as outpatient. D-dimer negative.  2. Chest pain: Troponins negative. 2-d echocardiogram reassuring. Elevated total CK and MB consistent with large muscle mass.  3. Acute renal failure/CKD II-III: Improved with IVF. Continue to hold HCTZ--patient is been off this for one month.  4. Leukocytosis: Resolved--secondary to stress reaction.  5. Hypertension: Fair control. Followup as an outpatient.  Patient reports a fast heart rate at times although he has never had episode like this before. Most likely multifactorial in nature--possibly forgotten atenolol with rebound tachycardia, dehydration secondary to strenuous outside work. Patient has no history of chest pain with exertion and is quite active. Signs and symptoms most consistent with SVT and coronary disease is doubted at this point. Will plan for outpatient cardiology evaluation for further recommendations. I had been unable to contact his physician at the number listed above which I have confirmed is the correct office number.  [place] cardiology will contact him for an office appointment in the near future.  Consultants:  None  Procedures:  None  Discharge Instructions  Discharge Orders    Future Orders Please Complete By Expires   Diet - low sodium heart healthy      Discharge instructions      Comments:   Maintain hydration with water and electrolyte solution such as Gatorade daily. Make sure to take atenolol daily as prescribed. Keep followup appointment with cardiology as an outpatient--the office will contact you for appointment day and time.   Activity as tolerated - No restrictions        Medication List  As of 12/12/2011 12:17 PM   STOP  taking these medications         hydrochlorothiazide 25 MG tablet         TAKE these medications         amLODipine 10 MG tablet   Commonly known as: NORVASC   Take 10 mg by mouth daily.      atenolol 50 MG tablet   Commonly known as: TENORMIN   Take 50 mg by mouth daily.      calcium carbonate 1250 MG tablet   Commonly known as: OS-CAL - dosed in mg of elemental calcium   Take 1 tablet by mouth daily.      vitamin A 16109 UNIT capsule   Take 10,000 Units by mouth daily.      vitamin B-12 1000 MCG tablet   Commonly known as: CYANOCOBALAMIN   Take 1,000 mcg by mouth daily.           The results of significant diagnostics from this hospitalization (including imaging, microbiology, ancillary and laboratory) are listed below for reference.    Significant Diagnostic Studies: Dg Chest 2 View  12/11/2011  *RADIOLOGY REPORT*  Clinical Data: Chest pressure.  CHEST - 2 VIEW  Comparison: PA and lateral chest 02/19/2010.  Findings: Lungs clear.  No pneumothorax or effusion.  Heart size normal.  IMPRESSION: Negative chest.  Original Report Authenticated By: Bernadene Bell. Maricela Curet, M.D.   Labs: Basic Metabolic Panel:  Lab 12/12/11 6045 12/12/11 0016 12/11/11 1931  NA 138 -- 140  K 3.7 -- 4.1  CL 103 -- 101  CO2 26 -- 25  GLUCOSE 105* -- 117*  BUN 21 -- 21  CREATININE 1.63* 1.88* 2.26*  CALCIUM 9.5 -- 10.0  MG -- -- --  PHOS -- -- --   Liver Function Tests:  Lab 12/12/11 0519  AST 24  ALT 21  ALKPHOS 75  BILITOT 0.4  PROT 7.8  ALBUMIN 3.8   CBC:  Lab 12/12/11 0519 12/12/11 0016 12/11/11 1931  WBC 8.5 10.6* 12.2*  NEUTROABS -- -- --  HGB 15.4 15.6 16.4  HCT 45.7 46.2 46.6  MCV 80.2 80.5 78.7  PLT 157 160 168   Cardiac Enzymes:  Lab 12/12/11 0840 12/12/11 0016 12/11/11 1931  CKTOTAL 531* 448* --  CKMB 4.4* 3.7 --  CKMBINDEX -- -- --  TROPONINI <0.30 <0.30 <0.30   Principal Problem:  *SVT (supraventricular tachycardia) Active Problems:  Chest pain   HYPERTENSION, BENIGN ESSENTIAL  ARF (acute renal failure)   Time coordinating discharge: 35 minutes.  Signed:  Brendia Sacks, MD [facility] 12/12/2011, 12:17 PM

## 2011-12-12 NOTE — Plan of Care (Signed)
Problem: Discharge Progression Outcomes Goal: Discharge plan in place and appropriate Outcome: Completed/Met Date Met:  12/12/11 Home when stable with spouse

## 2011-12-12 NOTE — ED Provider Notes (Signed)
I saw and evaluated the patient, reviewed the resident's note and I agree with the findings and plan and agree with their ECG interpretation. Patient presented to EMS with SVT. He was converted to sinus tachycardia with adenosine. He also had a worrisome story with chest pain. He has acute renal failure and will be admitted to medicine.  Juliet Rude. Rubin Payor, MD 12/12/11 1610

## 2011-12-12 NOTE — Progress Notes (Signed)
*  PRELIMINARY RESULTS* Echocardiogram 2D Echocardiogram has been performed.  Samuel Casey 12/12/2011, 11:01 AM

## 2011-12-23 ENCOUNTER — Encounter: Payer: Self-pay | Admitting: Cardiovascular Disease

## 2011-12-23 ENCOUNTER — Encounter: Payer: Self-pay | Admitting: *Deleted

## 2011-12-24 ENCOUNTER — Encounter: Payer: 59 | Admitting: Cardiovascular Disease

## 2012-01-15 ENCOUNTER — Encounter: Payer: 59 | Admitting: Cardiovascular Disease

## 2012-12-15 IMAGING — CR DG CHEST 2V
2 series · 2 of 2 positions shown · non-contrast
Comparison: PA and lateral chest 02/19/2010.

CLINICAL DATA: Chest pressure.

CHEST - 2 VIEW

[w chest pa]
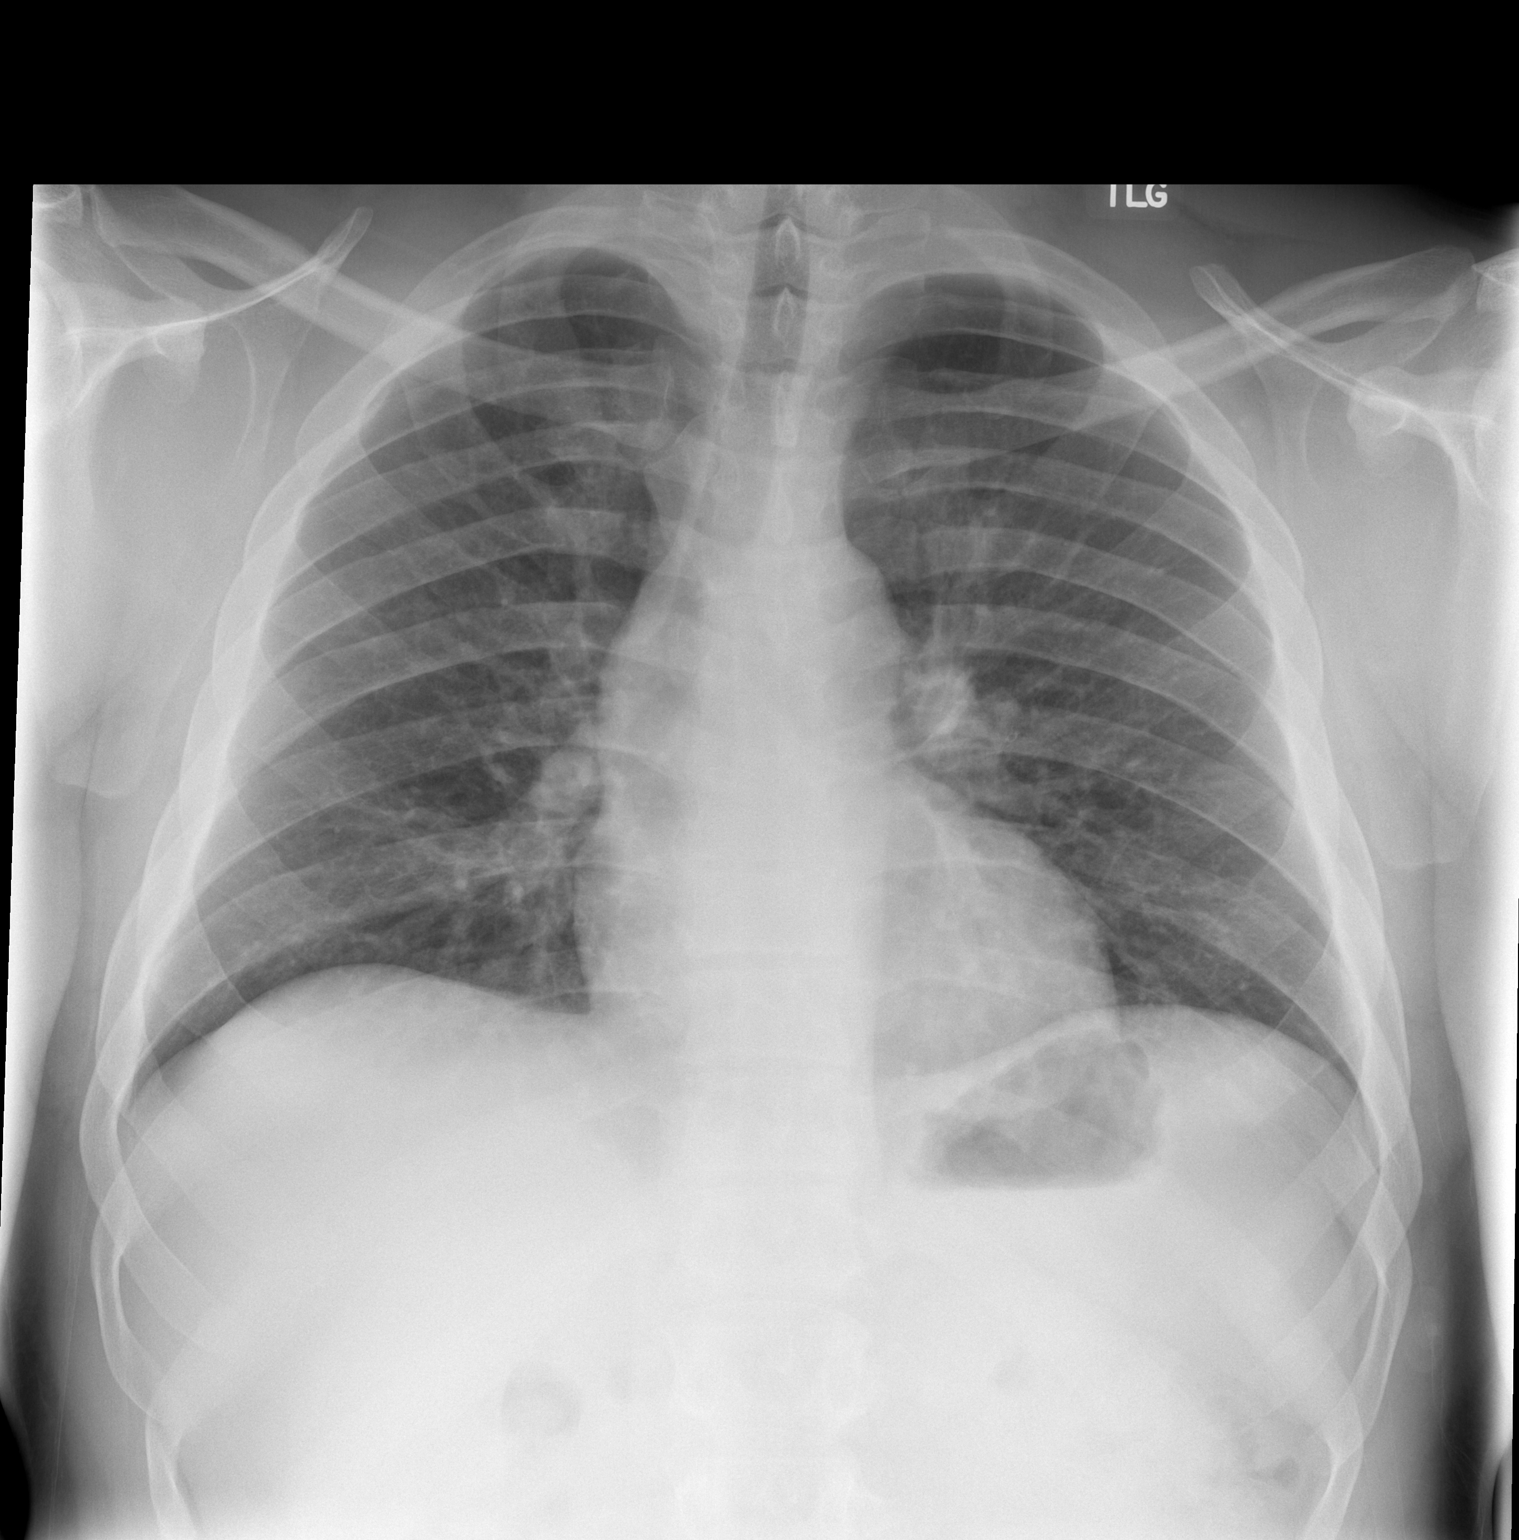

[w chest lat *]
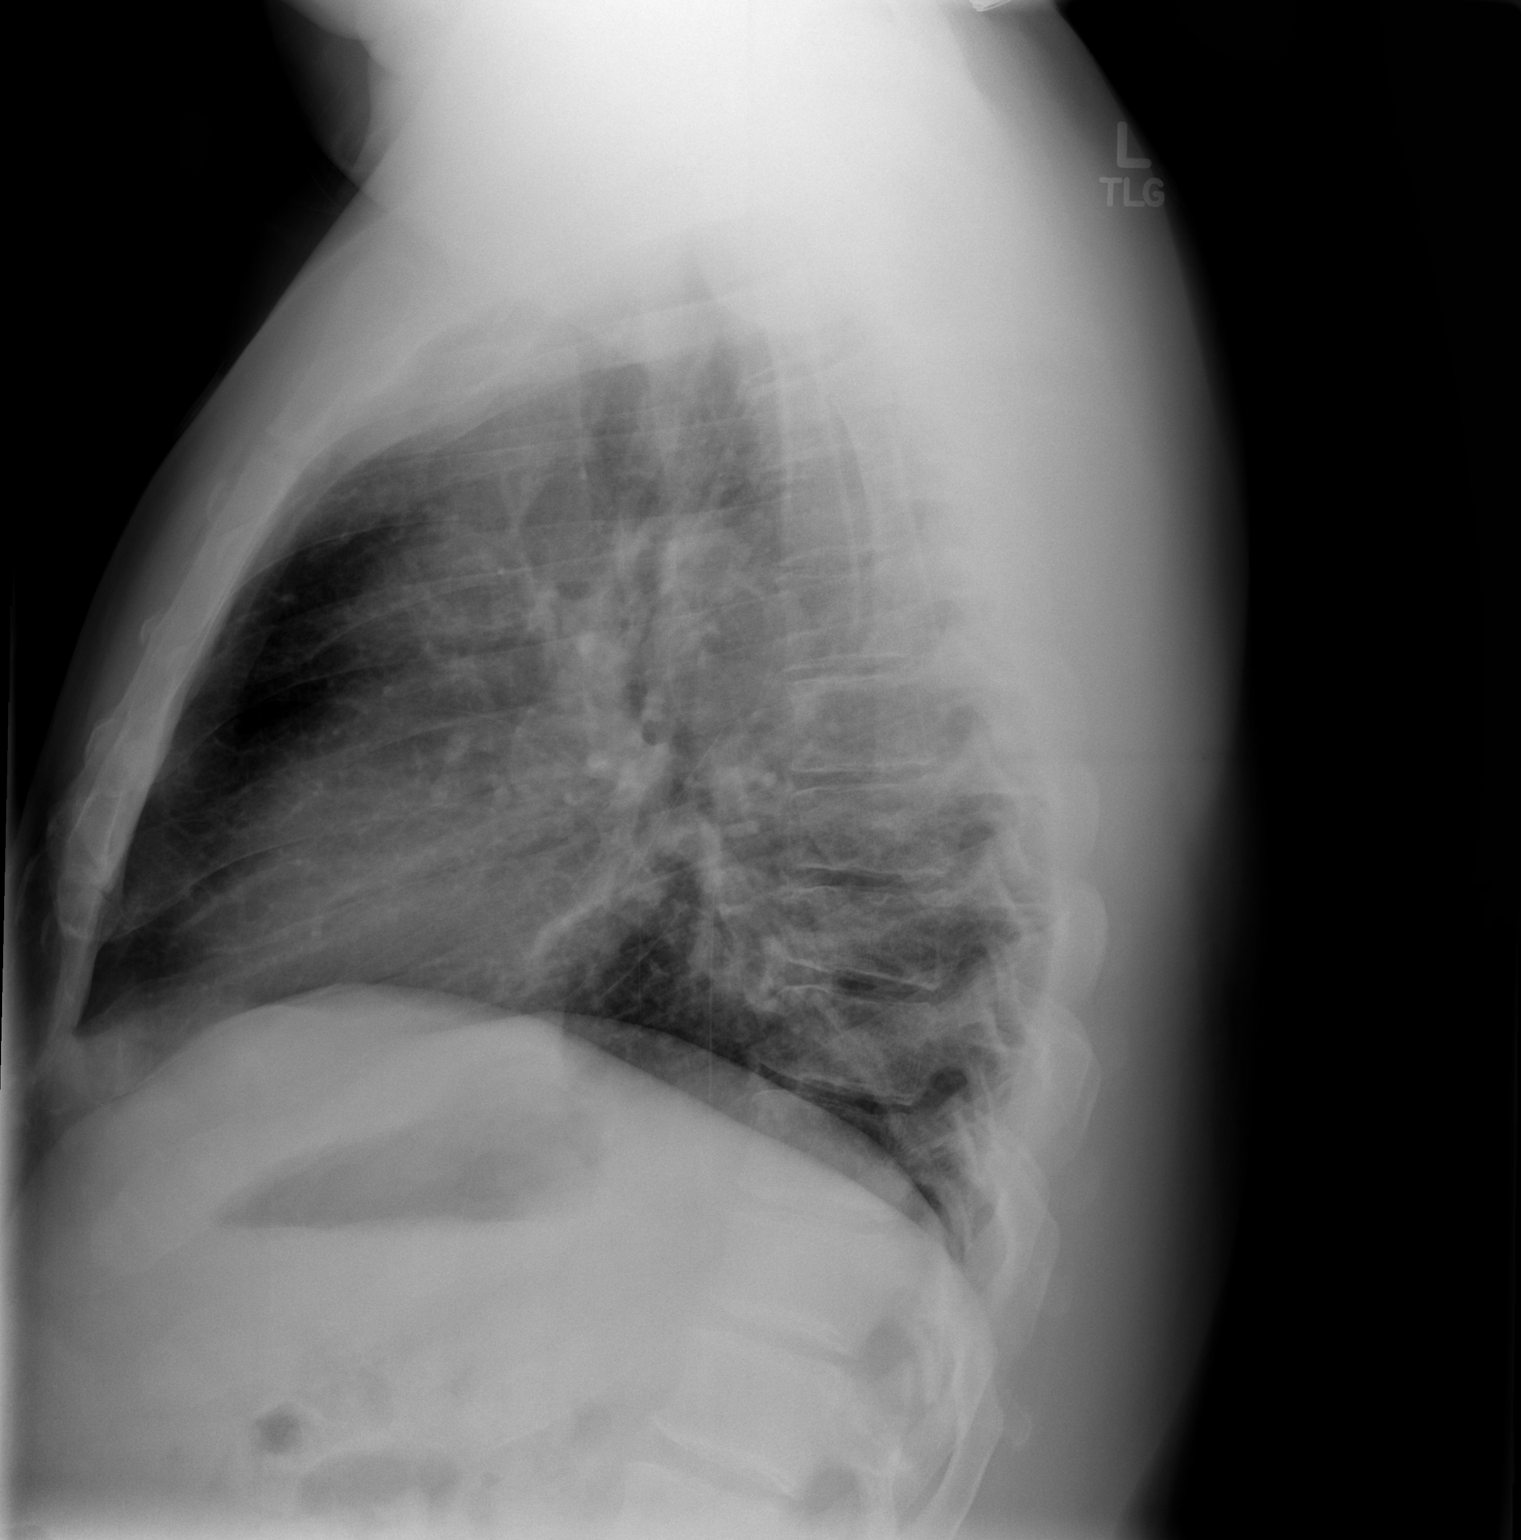

[2 of 2 positions shown; findings below may reference images not displayed]

FINDINGS: Lungs clear.  No pneumothorax or effusion.  Heart size
normal.
IMPRESSION: Negative chest.

## 2018-02-03 DIAGNOSIS — K219 Gastro-esophageal reflux disease without esophagitis: Secondary | ICD-10-CM

## 2018-02-03 DIAGNOSIS — M940 Chondrocostal junction syndrome [Tietze]: Secondary | ICD-10-CM

## 2018-02-03 HISTORY — DX: Chondrocostal junction syndrome (tietze): M94.0

## 2018-02-03 HISTORY — DX: Gastro-esophageal reflux disease without esophagitis: K21.9

## 2018-02-13 DIAGNOSIS — M545 Low back pain, unspecified: Secondary | ICD-10-CM

## 2018-02-13 DIAGNOSIS — D751 Secondary polycythemia: Secondary | ICD-10-CM

## 2018-02-13 DIAGNOSIS — N289 Disorder of kidney and ureter, unspecified: Secondary | ICD-10-CM

## 2018-02-13 DIAGNOSIS — R7303 Prediabetes: Secondary | ICD-10-CM

## 2018-02-13 DIAGNOSIS — E786 Lipoprotein deficiency: Secondary | ICD-10-CM

## 2018-02-13 HISTORY — DX: Low back pain, unspecified: M54.50

## 2018-02-13 HISTORY — DX: Prediabetes: R73.03

## 2018-02-13 HISTORY — DX: Secondary polycythemia: D75.1

## 2018-02-13 HISTORY — DX: Disorder of kidney and ureter, unspecified: N28.9

## 2018-02-13 HISTORY — DX: Lipoprotein deficiency: E78.6

## 2018-05-12 DIAGNOSIS — M7022 Olecranon bursitis, left elbow: Secondary | ICD-10-CM

## 2018-05-12 DIAGNOSIS — M1711 Unilateral primary osteoarthritis, right knee: Secondary | ICD-10-CM

## 2018-05-12 HISTORY — DX: Unilateral primary osteoarthritis, right knee: M17.11

## 2018-05-12 HISTORY — DX: Olecranon bursitis, left elbow: M70.22

## 2018-05-22 DIAGNOSIS — M76891 Other specified enthesopathies of right lower limb, excluding foot: Secondary | ICD-10-CM

## 2018-05-22 HISTORY — DX: Other specified enthesopathies of right lower limb, excluding foot: M76.891

## 2018-06-04 DIAGNOSIS — E559 Vitamin D deficiency, unspecified: Secondary | ICD-10-CM

## 2018-06-04 DIAGNOSIS — J301 Allergic rhinitis due to pollen: Secondary | ICD-10-CM

## 2018-06-04 HISTORY — DX: Allergic rhinitis due to pollen: J30.1

## 2018-06-04 HISTORY — DX: Vitamin D deficiency, unspecified: E55.9

## 2018-08-05 DIAGNOSIS — M722 Plantar fascial fibromatosis: Secondary | ICD-10-CM

## 2018-08-05 DIAGNOSIS — S93491A Sprain of other ligament of right ankle, initial encounter: Secondary | ICD-10-CM

## 2018-08-05 DIAGNOSIS — M2140 Flat foot [pes planus] (acquired), unspecified foot: Secondary | ICD-10-CM

## 2018-08-05 HISTORY — DX: Sprain of other ligament of right ankle, initial encounter: S93.491A

## 2018-08-05 HISTORY — DX: Plantar fascial fibromatosis: M72.2

## 2018-08-05 HISTORY — DX: Flat foot (pes planus) (acquired), unspecified foot: M21.40

## 2018-09-24 DIAGNOSIS — R002 Palpitations: Secondary | ICD-10-CM

## 2018-09-24 DIAGNOSIS — R0609 Other forms of dyspnea: Secondary | ICD-10-CM

## 2018-09-24 DIAGNOSIS — R0600 Dyspnea, unspecified: Secondary | ICD-10-CM

## 2018-09-24 HISTORY — DX: Dyspnea, unspecified: R06.00

## 2018-09-24 HISTORY — DX: Other forms of dyspnea: R06.09

## 2018-09-24 HISTORY — DX: Palpitations: R00.2

## 2018-10-13 DIAGNOSIS — M7989 Other specified soft tissue disorders: Secondary | ICD-10-CM

## 2018-10-13 DIAGNOSIS — L03115 Cellulitis of right lower limb: Secondary | ICD-10-CM

## 2018-10-13 HISTORY — DX: Cellulitis of right lower limb: L03.115

## 2018-10-13 HISTORY — DX: Other specified soft tissue disorders: M79.89

## 2019-01-04 DIAGNOSIS — M7651 Patellar tendinitis, right knee: Secondary | ICD-10-CM

## 2019-01-04 HISTORY — DX: Patellar tendinitis, right knee: M76.51

## 2019-02-04 DIAGNOSIS — R0902 Hypoxemia: Secondary | ICD-10-CM

## 2019-02-04 DIAGNOSIS — G4733 Obstructive sleep apnea (adult) (pediatric): Secondary | ICD-10-CM

## 2019-02-04 HISTORY — DX: Obstructive sleep apnea (adult) (pediatric): G47.33

## 2019-02-04 HISTORY — DX: Hypoxemia: R09.02

## 2019-02-24 DIAGNOSIS — L2082 Flexural eczema: Secondary | ICD-10-CM

## 2019-02-24 HISTORY — DX: Flexural eczema: L20.82

## 2020-05-29 DIAGNOSIS — I6529 Occlusion and stenosis of unspecified carotid artery: Secondary | ICD-10-CM

## 2020-05-29 HISTORY — DX: Occlusion and stenosis of unspecified carotid artery: I65.29

## 2020-10-23 ENCOUNTER — Encounter: Payer: Self-pay | Admitting: *Deleted

## 2020-10-23 ENCOUNTER — Encounter: Payer: Self-pay | Admitting: Cardiology

## 2020-11-01 ENCOUNTER — Ambulatory Visit: Payer: 59 | Admitting: Cardiology

## 2020-11-09 ENCOUNTER — Other Ambulatory Visit: Payer: Self-pay

## 2020-11-09 ENCOUNTER — Ambulatory Visit (INDEPENDENT_AMBULATORY_CARE_PROVIDER_SITE_OTHER): Payer: 59 | Admitting: Cardiology

## 2020-11-09 ENCOUNTER — Ambulatory Visit: Payer: 59

## 2020-11-09 ENCOUNTER — Encounter: Payer: Self-pay | Admitting: Cardiology

## 2020-11-09 VITALS — BP 132/80 | HR 72 | Ht 72.0 in | Wt 237.2 lb

## 2020-11-09 DIAGNOSIS — I10 Essential (primary) hypertension: Secondary | ICD-10-CM

## 2020-11-09 DIAGNOSIS — R0602 Shortness of breath: Secondary | ICD-10-CM

## 2020-11-09 DIAGNOSIS — I480 Paroxysmal atrial fibrillation: Secondary | ICD-10-CM

## 2020-11-09 DIAGNOSIS — R002 Palpitations: Secondary | ICD-10-CM | POA: Diagnosis not present

## 2020-11-09 NOTE — Patient Instructions (Addendum)
Medication Instructions:  Your physician recommends that you continue on your current medications as directed. Please refer to the Current Medication list given to you today.  *If you need a refill on your cardiac medications before your next appointment, please call your pharmacy*   Lab Work: None If you have labs (blood work) drawn today and your tests are completely normal, you will receive your results only by: Marland Kitchen MyChart Message (if you have MyChart) OR . A paper copy in the mail If you have any lab test that is abnormal or we need to change your treatment, we will call you to review the results.   Testing/Procedures: Your physician has requested that you have an echocardiogram. Echocardiography is a painless test that uses sound waves to create images of your heart. It provides your doctor with information about the size and shape of your heart and how well your heart's chambers and valves are working. This procedure takes approximately one hour. There are no restrictions for this procedure.  A zio monitor was ordered today. It will remain on for 14 days. You will then return monitor and event diary in provided box. It takes 1-2 weeks for report to be downloaded and returned to [place]. We will call you with the results. If monitor falls off or has orange flashing light, please call Zio for further instructions.    Follow-Up: At [facility], you and your health needs are our priority.  As part of our continuing mission to provide you with exceptional heart care, we have created designated Provider Care Teams.  These Care Teams include your primary Cardiologist (physician) and Advanced Practice Providers (APPs -  Physician Assistants and Nurse Practitioners) who all work together to provide you with the care you need, when you need it.  We recommend signing up for the patient portal called "MyChart".  Sign up information is provided on this After Visit Summary.  MyChart is used to connect  with patients for Virtual Visits (Telemedicine).  Patients are able to view lab/test results, encounter notes, upcoming appointments, etc.  Non-urgent messages can be sent to your provider as well.   To learn more about what you can do with MyChart, go to [email or web address].    Your next appointment:   12 week(s)  The format for your next appointment:   In Person  Provider:   Thomasene Ripple, DO   Other Instructions ZIO  WHY IS MY DOCTOR PRESCRIBING ZIO? The Zio system is proven and trusted by physicians to detect and diagnose irregular heart rhythms -- and has been prescribed to hundreds of thousands of patients.  The FDA has cleared the Zio system to monitor for many different kinds of irregular heart rhythms. In a study, physicians were able to reach a diagnosis 90% of the time with the Zio system1.  You can wear the Zio monitor -- a small, discreet, comfortable patch -- during your normal day-to-day activity, including while you sleep, shower, and exercise, while it records every single heartbeat for analysis.  1Barrett, P., et al. Comparison of 24 Hour Holter Monitoring Versus 14 Day Novel Adhesive Patch Electrocardiographic Monitoring. American Journal of Medicine, 2014.  ZIO VS. HOLTER MONITORING The Zio monitor can be comfortably worn for up to 14 days. Holter monitors can be worn for 24 to 48 hours, limiting the time to record any irregular heart rhythms you may have. Zio is able to capture data for the 51% of patients who have their first symptom-triggered arrhythmia after 48 hours.1  LIVE WITHOUT RESTRICTIONS The Zio ambulatory cardiac monitor is a small, unobtrusive, and water-resistant patch--you might even forget you're wearing it. The Zio monitor records and stores every beat of your heart, whether you're sleeping, working out, or showering. Remove on: May 26th 2022  Echocardiogram An echocardiogram is a test that uses sound waves (ultrasound) to produce images of the  heart. Images from an echocardiogram can provide important information about:  Heart size and shape.  The size and thickness and movement of your heart's walls.  Heart muscle function and strength.  Heart valve function or if you have stenosis. Stenosis is when the heart valves are too narrow.  If blood is flowing backward through the heart valves (regurgitation).  A tumor or infectious growth around the heart valves.  Areas of heart muscle that are not working well because of poor blood flow or injury from a heart attack.  Aneurysm detection. An aneurysm is a weak or damaged part of an artery wall. The wall bulges out from the normal force of blood pumping through the body. Tell a health care provider about:  Any allergies you have.  All medicines you are taking, including vitamins, herbs, eye drops, creams, and over-the-counter medicines.  Any blood disorders you have.  Any surgeries you have had.  Any medical conditions you have.  Whether you are pregnant or may be pregnant. What are the risks? Generally, this is a safe test. However, problems may occur, including an allergic reaction to dye (contrast) that may be used during the test. What happens before the test? No specific preparation is needed. You may eat and drink normally. What happens during the test?  You will take off your clothes from the waist up and put on a hospital gown.  Electrodes or electrocardiogram (ECG)patches may be placed on your chest. The electrodes or patches are then connected to a device that monitors your heart rate and rhythm.  You will lie down on a table for an ultrasound exam. A gel will be applied to your chest to help sound waves pass through your skin.  A handheld device, called a transducer, will be pressed against your chest and moved over your heart. The transducer produces sound waves that travel to your heart and bounce back (or "echo" back) to the transducer. These sound waves  will be captured in real-time and changed into images of your heart that can be viewed on a video monitor. The images will be recorded on a computer and reviewed by your health care provider.  You may be asked to change positions or hold your breath for a short time. This makes it easier to get different views or better views of your heart.  In some cases, you may receive contrast through an IV in one of your veins. This can improve the quality of the pictures from your heart. The procedure may vary among health care providers and hospitals.   What can I expect after the test? You may return to your normal, everyday life, including diet, activities, and medicines, unless your health care provider tells you not to do that. Follow these instructions at home:  It is up to you to get the results of your test. Ask your health care provider, or the department that is doing the test, when your results will be ready.  Keep all follow-up visits. This is important. Summary  An echocardiogram is a test that uses sound waves (ultrasound) to produce images of the heart.  Images from  an echocardiogram can provide important information about the size and shape of your heart, heart muscle function, heart valve function, and other possible heart problems.  You do not need to do anything to prepare before this test. You may eat and drink normally.  After the echocardiogram is completed, you may return to your normal, everyday life, unless your health care provider tells you not to do that. This information is not intended to replace advice given to you by your health care provider. Make sure you discuss any questions you have with your health care provider. Document Revised: 02/08/2020 Document Reviewed: 02/08/2020 Elsevier Patient Education  2021 [employer].

## 2020-11-10 NOTE — Progress Notes (Signed)
Cardiology Office Note:    Date:  11/10/2020   ID:  Samuel Casey, DOB [birth year], MRN [id number]  PCP:  Ronnette Hila, MD  Cardiologist:  None  Electrophysiologist:  None   Referring MD: Ronnette Hila, MD   " I was diagnosed with atrial fibrillation couple years ago my PCP wants me to follow back with cardiology"  History of Present Illness:    Samuel Casey is a 55 y.o. male with a hx of chronic paroxysmal atrial fibrillation, carotid stenosis, hypertension, hyperlipidemia is here today to establish cardiac care.  The patient tells me that he was diagnosed with atrial fibrillation many years ago he is not sure when it was.  But he was started on medications for it.  He tells me sometimes he experiences palpitations which he describes an abrupt onset of fast heartbeat which goes for minutes at a time prior to resolution.  Nothing makes it better or worse.  He does have shortness of breath on exertion but no chest pain.  Past Medical History:  Diagnosis Date  . Adductor tendinitis of right hip 05/22/2018  . Allergic rhinitis due to pollen 06/04/2018  . ARF (acute renal failure) (HCC)   . Carotid stenosis 05/29/2020   Formatting of this note might be different from the original. 04/19/2020-Multifocal ulcerated mixed soft and calcified atherosclerotic plaque along the right greater than left carotid bifurcations and proximal internal carotid arteries with stenosis approaching 40% in the proximal right ICA. Recommend outpatient-based risk factor modification and continued attention on follow-up imaging.    Last A  . Cellulitis of right leg 10/13/2018  . Chest pain   . Costochondritis 02/03/2018  . Dyspnea on exertion 09/24/2018   Formatting of this note might be different from the original. 09/24/18-ZIO patch monitor results reveal Sinus Rhythm with an average heart rate of 81 bpm; Marked artifact noted; Rare PVC; 1 PAC; Max Hr-167 bpm; 5 beat SVT run; Variable ST-T changes  10/07/18-Nuclear  Stress test results were normal, revealing normal LV function with no evidence of ischemia or infarction.   10/07/18-Echo results rev  . ERECTILE DYSFUNCTION   . Flat foot 08/05/2018  . Flexural eczema 02/24/2019  . Gastroesophageal reflux disease without esophagitis 02/03/2018  . HYPERTENSION, BENIGN ESSENTIAL 07/01/2005   Qualifier: Diagnosis of  By: Daphine Deutscher FNP, Zena Amos    . HYPERTRIGLYCERIDEMIA   . Hypoxemia 02/04/2019  . Low back pain without sciatica 02/13/2018  . Low HDL (under 40) 02/13/2018  . Obstructive sleep apnea 02/04/2019   Formatting of this note might be different from the original. 01/09/19-Sleep study results revealed Severe obstructive sleep apnea. Report Noted: C-PAP may be initiated with the above pressure and interface to treat the  patient's sleep-disordered breathing. If the patient is poorly tolerant to PAP, further anatomic evaluation for possible surgical intervention may also be considered.   Last Asses  . Olecranon bursitis of left elbow 05/12/2018  . Osteoarthritis of right knee 05/12/2018  . Palpitations 09/24/2018   Formatting of this note might be different from the original.     Last Assessment & Plan:  Formatting of this note might be different from the original. Refer to HPI; Atrial Fib assessment  . Patellar tendonitis of both knees 01/04/2019  . Plantar fasciitis 08/05/2018  . Polycythemia 02/13/2018  . Prediabetes 02/13/2018  . Renal insufficiency 02/13/2018  . Right leg swelling 10/13/2018  . Sprain of anterior talofibular ligament of right ankle 08/05/2018  . SVT (supraventricular tachycardia) (HCC)   . SYPHILIS,  LATENT   . Vitamin D deficiency 06/04/2018    Past Surgical History:  Procedure Laterality Date  . NO PAST SURGERIES      Current Medications: Current Meds  Medication Sig  . albuterol (VENTOLIN HFA) 108 (90 Base) MCG/ACT inhaler Inhale 2 puffs into the lungs every 6 (six) hours as needed for wheezing.  Marland Kitchen allopurinol (ZYLOPRIM) 100 MG tablet Take 200 mg  by mouth daily.  Marland Kitchen aspirin 81 MG chewable tablet Chew 81 mg by mouth daily.  . Cholecalciferol 50 MCG (2000 UT) TABS Take 2,000 Units by mouth daily.  . colchicine 0.6 MG tablet Take 0.6 mg by mouth as directed. Day 1: Take 2 pills by mouth now and then a third pill by mouth 1 hour later. Day 2: take 1 pill by mouth every 12 hours until pain is gone for 2-3 days.  . cyclobenzaprine (FLEXERIL) 10 MG tablet Take 10 mg by mouth at bedtime.  . fluticasone (FLONASE) 50 MCG/ACT nasal spray Place 1 spray into the nose daily.  Marland Kitchen levocetirizine (XYZAL) 5 MG tablet Take 5 mg by mouth at bedtime.  Marland Kitchen lisinopril-hydrochlorothiazide (ZESTORETIC) 20-12.5 MG tablet Take 1 tablet by mouth daily.  . metoprolol tartrate (LOPRESSOR) 25 MG tablet Take 1 tablet by mouth 2 (two) times daily.  . rosuvastatin (CRESTOR) 10 MG tablet Take 10 mg by mouth daily.  . [DISCONTINUED] fluticasone (FLOVENT HFA) 110 MCG/ACT inhaler Inhale 1 puff into the lungs 2 (two) times daily.     Allergies:   Patient has no known allergies.   Social History   Socioeconomic History  . Marital status: Legally Separated    Spouse name: Not on file  . Number of children: Not on file  . Years of education: Not on file  . Highest education level: Not on file  Occupational History  . Not on file  Tobacco Use  . Smoking status: Never Smoker  . Smokeless tobacco: Never Used  Substance and Sexual Activity  . Alcohol use: No  . Drug use: No  . Sexual activity: Not on file  Other Topics Concern  . Not on file  Social History Narrative  . Not on file   Social Determinants of Health   Financial Resource Strain: Not on file  Food Insecurity: Not on file  Transportation Needs: Not on file  Physical Activity: Not on file  Stress: Not on file  Social Connections: Not on file     Family History: The patient's family history includes Diabetes in his father; Sickle cell anemia in an other family member.  ROS:   Review of Systems   Constitution: Negative for decreased appetite, fever and weight gain.  HENT: Negative for congestion, ear discharge, hoarse voice and sore throat.   Eyes: Negative for discharge, redness, vision loss in right eye and visual halos.  Cardiovascular: Negative for chest pain, dyspnea on exertion, leg swelling, orthopnea and palpitations.  Respiratory: Negative for cough, hemoptysis, shortness of breath and snoring.   Endocrine: Negative for heat intolerance and polyphagia.  Hematologic/Lymphatic: Negative for bleeding problem. Does not bruise/bleed easily.  Skin: Negative for flushing, nail changes, rash and suspicious lesions.  Musculoskeletal: Negative for arthritis, joint pain, muscle cramps, myalgias, neck pain and stiffness.  Gastrointestinal: Negative for abdominal pain, bowel incontinence, diarrhea and excessive appetite.  Genitourinary: Negative for decreased libido, genital sores and incomplete emptying.  Neurological: Negative for brief paralysis, focal weakness, headaches and loss of balance.  Psychiatric/Behavioral: Negative for altered mental status, depression and suicidal  ideas.  Allergic/Immunologic: Negative for HIV exposure and persistent infections.    EKGs/Labs/Other Studies Reviewed:    The following studies were reviewed today:   EKG:  The ekg ordered today demonstrates sinus rhythm, heart rate 72 bpm with underlying right bundle branch block and left atrial fascicular block.  Recent Labs: No results found for requested labs within last 8760 hours.  Recent Lipid Panel    Component Value Date/Time   CHOL 179 09/28/2008 0000   TRIG 441 (H) 09/28/2008 0000   HDL 31 (L) 09/28/2008 0000   CHOLHDL 5.8 Ratio 09/28/2008 0000   VLDL NOT CALC mg/dL 47/82/956203/31/2010 13080000   LDLCALC See Comment mg/dL 65/78/469603/31/2010 29520000    Physical Exam:    VS:  BP 132/80 (BP Location: Right Arm)   Pulse 72   Ht 6' (1.829 m)   Wt 237 lb 3.2 oz (107.6 kg)   SpO2 97%   BMI 32.17 kg/m     Wt  Readings from Last 3 Encounters:  11/09/20 237 lb 3.2 oz (107.6 kg)  10/12/20 238 lb (108 kg)  12/12/11 245 lb 1.6 oz (111.2 kg)     GEN: Well nourished, well developed in no acute distress HEENT: Normal NECK: No JVD; No carotid bruits LYMPHATICS: No lymphadenopathy CARDIAC: S1S2 noted,RRR, no murmurs, rubs, gallops RESPIRATORY:  Clear to auscultation without rales, wheezing or rhonchi  ABDOMEN: Soft, non-tender, non-distended, +bowel sounds, no guarding. EXTREMITIES: No edema, No cyanosis, no clubbing MUSCULOSKELETAL:  No deformity  SKIN: Warm and dry NEUROLOGIC:  Alert and oriented x 3, non-focal PSYCHIATRIC:  Normal affect, good insight  ASSESSMENT:    1. SOB (shortness of breath)   2. PAF (paroxysmal atrial fibrillation) (HCC)   3. Hypertension, unspecified type   4. Palpitations    PLAN:    Giving her shortness of breath I like to place a monitor on the patient and see how often he is going in and out of atrial fibrillation.  In the meantime we will get an echocardiogram to reassess his LV function.  The patient understands the need to lose weight with diet and exercise. We have discussed specific strategies for this.  His blood pressure acceptable in the office no changes will be made.  The patient is in agreement with the above plan. The patient left the office in stable condition.  The patient will follow up in 12 weeks.   Medication Adjustments/Labs and Tests Ordered: Current medicines are reviewed at length with the patient today.  Concerns regarding medicines are outlined above.  Orders Placed This Encounter  Procedures  . LONG TERM MONITOR (3-14 DAYS)  . EKG 12-Lead  . ECHOCARDIOGRAM COMPLETE   No orders of the defined types were placed in this encounter.   Patient Instructions   Medication Instructions:  Your physician recommends that you continue on your current medications as directed. Please refer to the Current Medication list given to you  today.  *If you need a refill on your cardiac medications before your next appointment, please call your pharmacy*   Lab Work: None If you have labs (blood work) drawn today and your tests are completely normal, you will receive your results only by: Marland Kitchen. MyChart Message (if you have MyChart) OR . A paper copy in the mail If you have any lab test that is abnormal or we need to change your treatment, we will call you to review the results.   Testing/Procedures: Your physician has requested that you have an echocardiogram. Echocardiography is a  painless test that uses sound waves to create images of your heart. It provides your doctor with information about the size and shape of your heart and how well your heart's chambers and valves are working. This procedure takes approximately one hour. There are no restrictions for this procedure.  A zio monitor was ordered today. It will remain on for 14 days. You will then return monitor and event diary in provided box. It takes 1-2 weeks for report to be downloaded and returned to [place]. We will call you with the results. If monitor falls off or has orange flashing light, please call Zio for further instructions.    Follow-Up: At [facility], you and your health needs are our priority.  As part of our continuing mission to provide you with exceptional heart care, we have created designated Provider Care Teams.  These Care Teams include your primary Cardiologist (physician) and Advanced Practice Providers (APPs -  Physician Assistants and Nurse Practitioners) who all work together to provide you with the care you need, when you need it.  We recommend signing up for the patient portal called "MyChart".  Sign up information is provided on this After Visit Summary.  MyChart is used to connect with patients for Virtual Visits (Telemedicine).  Patients are able to view lab/test results, encounter notes, upcoming appointments, etc.  Non-urgent messages can be sent  to your provider as well.   To learn more about what you can do with MyChart, go to [email or web address].    Your next appointment:   12 week(s)  The format for your next appointment:   In Person  Provider:   Thomasene Ripple, DO   Other Instructions ZIO  WHY IS MY DOCTOR PRESCRIBING ZIO? The Zio system is proven and trusted by physicians to detect and diagnose irregular heart rhythms -- and has been prescribed to hundreds of thousands of patients.  The FDA has cleared the Zio system to monitor for many different kinds of irregular heart rhythms. In a study, physicians were able to reach a diagnosis 90% of the time with the Zio system1.  You can wear the Zio monitor -- a small, discreet, comfortable patch -- during your normal day-to-day activity, including while you sleep, shower, and exercise, while it records every single heartbeat for analysis.  1Barrett, P., et al. Comparison of 24 Hour Holter Monitoring Versus 14 Day Novel Adhesive Patch Electrocardiographic Monitoring. American Journal of Medicine, 2014.  ZIO VS. HOLTER MONITORING The Zio monitor can be comfortably worn for up to 14 days. Holter monitors can be worn for 24 to 48 hours, limiting the time to record any irregular heart rhythms you may have. Zio is able to capture data for the 51% of patients who have their first symptom-triggered arrhythmia after 48 hours.1  LIVE WITHOUT RESTRICTIONS The Zio ambulatory cardiac monitor is a small, unobtrusive, and water-resistant patch--you might even forget you're wearing it. The Zio monitor records and stores every beat of your heart, whether you're sleeping, working out, or showering. Remove on: May 26th 2022  Echocardiogram An echocardiogram is a test that uses sound waves (ultrasound) to produce images of the heart. Images from an echocardiogram can provide important information about:  Heart size and shape.  The size and thickness and movement of your heart's  walls.  Heart muscle function and strength.  Heart valve function or if you have stenosis. Stenosis is when the heart valves are too narrow.  If blood is flowing backward through the heart valves (regurgitation).  A tumor or infectious growth around the heart valves.  Areas of heart muscle that are not working well because of poor blood flow or injury from a heart attack.  Aneurysm detection. An aneurysm is a weak or damaged part of an artery wall. The wall bulges out from the normal force of blood pumping through the body. Tell a health care provider about:  Any allergies you have.  All medicines you are taking, including vitamins, herbs, eye drops, creams, and over-the-counter medicines.  Any blood disorders you have.  Any surgeries you have had.  Any medical conditions you have.  Whether you are pregnant or may be pregnant. What are the risks? Generally, this is a safe test. However, problems may occur, including an allergic reaction to dye (contrast) that may be used during the test. What happens before the test? No specific preparation is needed. You may eat and drink normally. What happens during the test?  You will take off your clothes from the waist up and put on a hospital gown.  Electrodes or electrocardiogram (ECG)patches may be placed on your chest. The electrodes or patches are then connected to a device that monitors your heart rate and rhythm.  You will lie down on a table for an ultrasound exam. A gel will be applied to your chest to help sound waves pass through your skin.  A handheld device, called a transducer, will be pressed against your chest and moved over your heart. The transducer produces sound waves that travel to your heart and bounce back (or "echo" back) to the transducer. These sound waves will be captured in real-time and changed into images of your heart that can be viewed on a video monitor. The images will be recorded on a computer and  reviewed by your health care provider.  You may be asked to change positions or hold your breath for a short time. This makes it easier to get different views or better views of your heart.  In some cases, you may receive contrast through an IV in one of your veins. This can improve the quality of the pictures from your heart. The procedure may vary among health care providers and hospitals.   What can I expect after the test? You may return to your normal, everyday life, including diet, activities, and medicines, unless your health care provider tells you not to do that. Follow these instructions at home:  It is up to you to get the results of your test. Ask your health care provider, or the department that is doing the test, when your results will be ready.  Keep all follow-up visits. This is important. Summary  An echocardiogram is a test that uses sound waves (ultrasound) to produce images of the heart.  Images from an echocardiogram can provide important information about the size and shape of your heart, heart muscle function, heart valve function, and other possible heart problems.  You do not need to do anything to prepare before this test. You may eat and drink normally.  After the echocardiogram is completed, you may return to your normal, everyday life, unless your health care provider tells you not to do that. This information is not intended to replace advice given to you by your health care provider. Make sure you discuss any questions you have with your health care provider. Document Revised: 02/08/2020 Document Reviewed: 02/08/2020 Elsevier Patient Education  2021 Elsevier Inc.      Adopting a Healthy Lifestyle.  Know what a  healthy weight is for you (roughly BMI <25) and aim to maintain this   Aim for 7+ servings of fruits and vegetables daily   65-80+ fluid ounces of water or unsweet tea for healthy kidneys   Limit to max 1 drink of alcohol per day; avoid  smoking/tobacco   Limit animal fats in diet for cholesterol and heart health - choose grass fed whenever available   Avoid highly processed foods, and foods high in saturated/trans fats   Aim for low stress - take time to unwind and care for your mental health   Aim for 150 min of moderate intensity exercise weekly for heart health, and weights twice weekly for bone health   Aim for 7-9 hours of sleep daily   When it comes to diets, agreement about the perfect plan isnt easy to find, even among the experts. Experts at the [facility] of [employer] developed an idea known as the Healthy Eating Plate. Just imagine a plate divided into logical, healthy portions.   The emphasis is on diet quality:   Load up on vegetables and fruits - one-half of your plate: Aim for color and variety, and remember that potatoes dont count.   Go for whole grains - one-quarter of your plate: Whole wheat, barley, wheat berries, quinoa, oats, brown rice, and foods made with them. If you want pasta, go with whole wheat pasta.   Protein power - one-quarter of your plate: Fish, chicken, beans, and nuts are all healthy, versatile protein sources. Limit red meat.   The diet, however, does go beyond the plate, offering a few other suggestions.   Use healthy plant oils, such as olive, canola, soy, corn, sunflower and peanut. Check the labels, and avoid partially hydrogenated oil, which have unhealthy trans fats.   If youre thirsty, drink water. Coffee and tea are good in moderation, but skip sugary drinks and limit milk and dairy products to one or two daily servings.   The type of carbohydrate in the diet is more important than the amount. Some sources of carbohydrates, such as vegetables, fruits, whole grains, and beans-are healthier than others.   Finally, stay active  Signed, Thomasene Ripple, DO  11/10/2020 2:42 PM    [facility]

## 2020-12-05 ENCOUNTER — Other Ambulatory Visit: Payer: 59

## 2020-12-12 ENCOUNTER — Telehealth: Payer: Self-pay | Admitting: *Deleted

## 2020-12-12 NOTE — Telephone Encounter (Signed)
Received email from [employer] that this pt's monitor has not been mailed back yet. Called pt and he stated would put monitor in the mail today.

## 2020-12-29 ENCOUNTER — Other Ambulatory Visit: Payer: 59

## 2021-02-09 ENCOUNTER — Ambulatory Visit: Payer: 59 | Admitting: Cardiology

## 2021-03-15 ENCOUNTER — Other Ambulatory Visit: Payer: 59
